# Patient Record
Sex: Male | Born: 2005 | Race: Black or African American | Hispanic: No | Marital: Single | Smoking: Never smoker
Health system: Southern US, Community
[De-identification: ages and names within clinical notes are randomized; demographics above are authoritative.]

---
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
## 2023-06-04 IMAGING — DX DG FINGER MIDDLE 2+V*R*
3 series · 3 of 3 positions shown · non-contrast
Comparison: None.

CLINICAL DATA: Trauma

EXAM:
RIGHT MIDDLE FINGER 2+V

[finger obl]
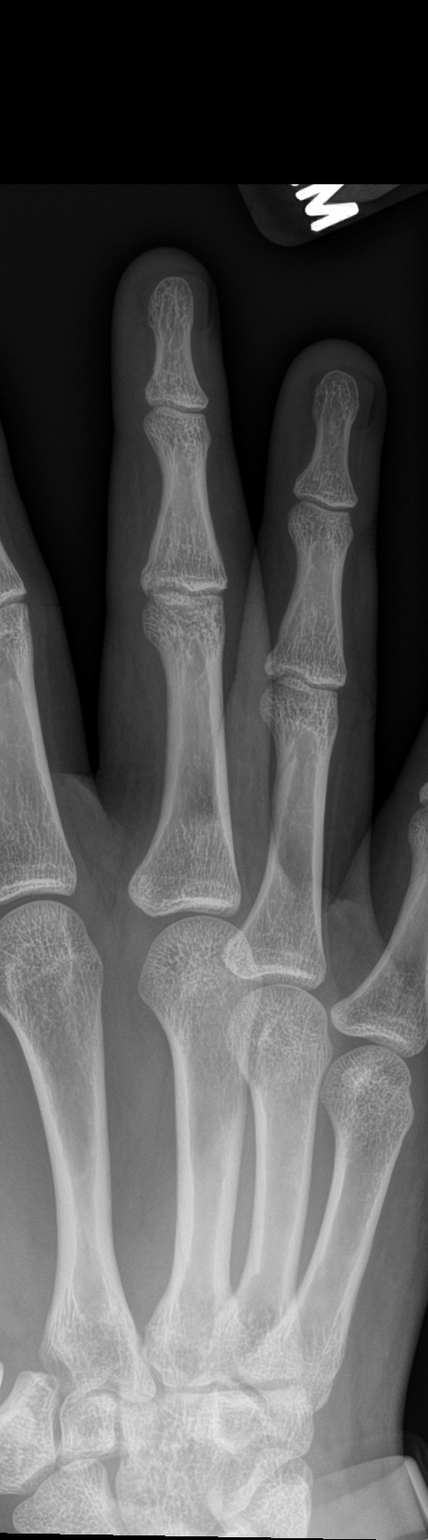

[finger lat]
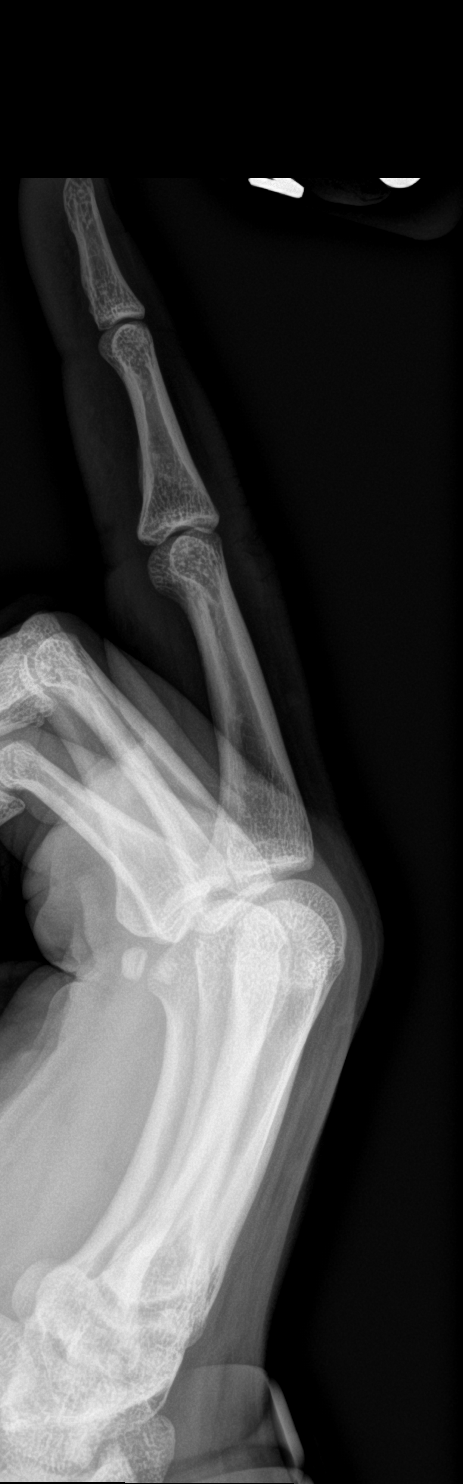

[finger ap]
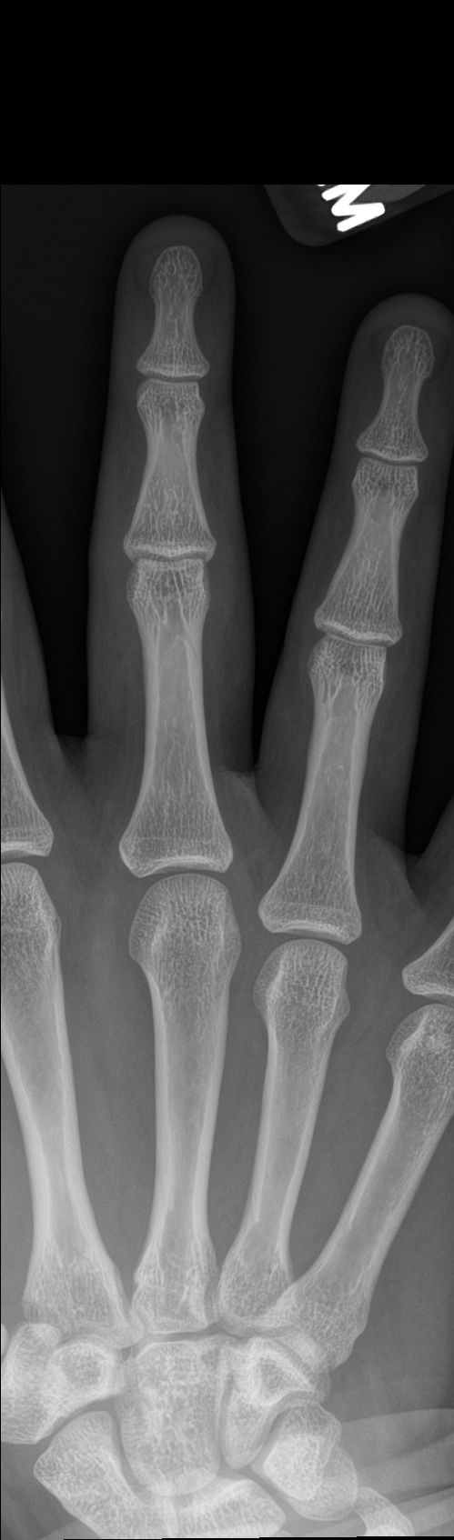

[3 of 3 positions shown; findings below may reference images not displayed]

FINDINGS: There is no evidence of fracture or dislocation. There is no
evidence of arthropathy or other focal bone abnormality. There is
soft tissue swelling over the PIP joint.
IMPRESSION: No fracture or dislocation is seen in right middle finger. There are
no opaque foreign bodies.

Reading location: [place], [place].

## 2023-06-08 ENCOUNTER — Ambulatory Visit: Admission: EM | Admit: 2023-06-08 | Discharge: 2023-06-08 | Disposition: A | Discharge status: Home / Self Care

## 2023-06-08 ENCOUNTER — Encounter: Payer: Self-pay | Admitting: Emergency Medicine

## 2023-06-08 DIAGNOSIS — R17 Unspecified jaundice: Secondary | ICD-10-CM

## 2023-06-08 NOTE — ED Triage Notes (Addendum)
 Pt reports yellowing of both eyes x41yr. No vision changes or other symptoms. Does not have PCP. Pt unsure of the last time he had physical or blood work, but reports it has been [employer]. Eyes appear jaundiced. No family hx of liver or pancreas conditions.

## 2023-06-08 NOTE — Discharge Instructions (Addendum)
 You are being scheduled with a primary care doctor for further work up and evaluation of eye yellowing for over one year.

## 2023-06-08 NOTE — ED Provider Notes (Signed)
 [facility]    CSN: [id number] Arrival date & time: 06/08/23  1719      History   Chief Complaint Chief Complaint  Patient presents with   Eye Problem    HPI Barry Buchanan is a 18 y.o. male.  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  Patient has not had any medical or physical exam since he was an adolescent. He reports no visual acuity changes but people have been telling him that his eyes are yellowing more and he has noticed that the occurrences are coming more frequent. Patient is non drinker, no hx of liver disease, sickle cell, or anemia . He is a contact lens wearer with last eye less than one year ago. History reviewed. No pertinent past medical history.  Patient Active Problem List   Diagnosis Date Noted   Obesity 01/08/2016    History reviewed. No pertinent surgical history.     Home Medications    Prior to Admission medications   Not on File    Family History History reviewed. No pertinent family history.  Social History Social History   Tobacco Use   Smoking status: Never   Smokeless tobacco: Never  Vaping Use   Vaping status: Some Days   Substances: Nicotine  Substance Use Topics   Alcohol use: No   Drug use: No     Allergies   Patient has no known allergies.   Review of Systems Review of Systems Pertinent negatives listed in HPI   Physical Exam Triage Vital Signs ED Triage Vitals  Encounter Vitals Group     BP 06/08/23 1812 123/78     Systolic BP Percentile --      Diastolic BP Percentile --      Pulse Rate 06/08/23 1812 64     Resp 06/08/23 1812 14     Temp 06/08/23 1812 98.6 F (37 C)     Temp Source 06/08/23 1812 Oral     SpO2 06/08/23 1812 96 %     Weight --      Height --      Head Circumference --      Peak Flow --      Pain Score 06/08/23 1813 0     Pain Loc --      Pain Education --      Exclude from Growth Chart --     No data found.  Updated Vital Signs BP 123/78 (BP Location: Left Arm)   Pulse 64   Temp 98.6 F (37 C) (Oral)   Resp 14   SpO2 96%   Visual Acuity Right Eye Distance:   Left Eye Distance:   Bilateral Distance:    Right Eye Near:   Left Eye Near:    Bilateral Near:     Physical Exam Vitals reviewed.  Constitutional:      General: He is not in acute distress.    Appearance: Normal appearance. He is not ill-appearing.  HENT:     Head: Normocephalic and atraumatic.     Nose: Nose normal.  Eyes:     General: Scleral icterus present.     Extraocular Movements: Extraocular movements intact.     Conjunctiva/sclera: Conjunctivae normal.     Pupils: Pupils are equal, round, and reactive to light.  Cardiovascular:     Rate and Rhythm: Normal rate and regular rhythm.  Pulmonary:     Effort: Pulmonary effort is normal.  Breath sounds: Normal breath sounds.  Abdominal:     General: Abdomen is flat. Bowel sounds are normal.  Musculoskeletal:        General: Normal range of motion.     Cervical back: Normal range of motion and neck supple.  Skin:    General: Skin is warm and dry.  Neurological:     General: No focal deficit present.     Mental Status: He is alert and oriented to person, place, and time.      UC Treatments / Results  Labs (all labs ordered are listed, but only abnormal results are displayed) Labs Reviewed - No data to display  EKG   Radiology No results found.  Procedures Procedures (including critical care time)  Medications Ordered in UC Medications - No data to display  Initial Impression / Assessment and Plan / UC Course  I have reviewed the triage vital signs and the nursing notes.  Pertinent labs & imaging results that were available during my care of the patient were reviewed by me and considered in my medical decision making (see chart for details).    Patient schedule to establish with a new primary care provider tomorrow  morning. Work note provided to cover morning abscess from work. Patient verbalized the importance of making this scheduled appointment.   Discharge Instructions      You are being scheduled with a primary care doctor for further work up and evaluation of eye yellowing for over one year.      ED Prescriptions   None    PDMP not reviewed this encounter.   Bing Neighbors, NP 06/08/23 2337

## 2023-06-09 ENCOUNTER — Encounter: Payer: Self-pay | Admitting: Family Medicine

## 2023-06-09 ENCOUNTER — Ambulatory Visit: Admitting: Family Medicine

## 2023-06-09 VITALS — BP 143/73 | HR 65 | Temp 98.1°F | Ht 72.0 in | Wt 215.0 lb

## 2023-06-09 DIAGNOSIS — Z7689 Persons encountering health services in other specified circumstances: Secondary | ICD-10-CM | POA: Diagnosis not present

## 2023-06-09 DIAGNOSIS — R17 Unspecified jaundice: Secondary | ICD-10-CM

## 2023-06-09 NOTE — Assessment & Plan Note (Addendum)
 Jaundice present for one year. Sclera yellow.  Denies alcohol use, nausea, vomiting, abdominal pain. Negative abdominal exam in office.  No family history of liver problems. Labs today. Abdominal ultrasound for further evaluation. Will need referral to specialist.

## 2023-06-09 NOTE — Progress Notes (Signed)
 New Patient Office Visit  Subjective    Patient ID: Barry Buchanan, male    DOB: 2005-07-23  Age: 18 y.o. MRN: [id number]  CC:  Chief Complaint  Patient presents with   Establish Care   Scleral Icterus    Urgent Care follow up    HPI Barry Buchanan presents to establish care with this practice. He is new to me.  Past year, "my eyes have been yellow" Not endorsing regular alcohol use.  No abdominal pain, nausea, vomiting, diarrhea. No symptoms associated with eating. No family history of liver problems.    Chart review: 4/9: urgent care Scleral icterus    No outpatient encounter medications on file as of 06/09/2023.   No facility-administered encounter medications on file as of 06/09/2023.    History reviewed. No pertinent past medical history.  History reviewed. No pertinent surgical history.  History reviewed. No pertinent family history.  Social History   Socioeconomic History   Marital status: Single    Spouse name: Not on file   Number of children: Not on file   Years of education: Not on file   Highest education level: Not on file  Occupational History   Not on file  Tobacco Use   Smoking status: Never   Smokeless tobacco: Never  Vaping Use   Vaping status: Some Days   Substances: Nicotine  Substance and Sexual Activity   Alcohol use: No   Drug use: No   Sexual activity: Not on file  Other Topics Concern   Not on file  Social History Narrative   Not on file   Social Drivers of Health   Financial Resource Strain: Not on file  Food Insecurity: Not on file  Transportation Needs: Not on file  Physical Activity: Not on file  Stress: Not on file  Social Connections: Not on file  Intimate Partner Violence: Not on file    Review of Systems  Gastrointestinal:  Negative for abdominal pain, constipation, diarrhea, nausea and vomiting.        Objective    BP (!) 143/73 (BP Location: Left Arm, Patient Position: Sitting, Cuff Size:  Large)   Pulse 65   Temp 98.1 F (36.7 C) (Oral)   Ht 6' (1.829 m)   Wt 215 lb (97.5 kg)   SpO2 98%   BMI 29.16 kg/m   Physical Exam Vitals and nursing note reviewed.  Constitutional:      General: He is not in acute distress.    Appearance: Normal appearance.  Cardiovascular:     Rate and Rhythm: Normal rate and regular rhythm.     Heart sounds: Normal heart sounds.  Pulmonary:     Effort: Pulmonary effort is normal.     Breath sounds: Normal breath sounds.  Abdominal:     General: Bowel sounds are normal.     Palpations: Abdomen is soft.     Tenderness: There is no abdominal tenderness.  Skin:    General: Skin is warm and dry.  Neurological:     General: No focal deficit present.     Mental Status: He is alert. Mental status is at baseline.  Psychiatric:        Mood and Affect: Mood normal.        Behavior: Behavior normal.        Thought Content: Thought content normal.        Judgment: Judgment normal.        Assessment & Plan:   Problem List  Items Addressed This Visit     Establishing care with new doctor, encounter for - Primary   Scleral icterus   Jaundice present for one year. Sclera yellow.  Denies alcohol use, nausea, vomiting, abdominal pain. Negative abdominal exam in office.  No family history of liver problems. Labs today. Abdominal ultrasound for further evaluation. Will need referral to specialist.      Relevant Orders   CBC with Differential/Platelet   Comprehensive metabolic panel with GFR   Lipid panel   Acute Viral Hepatitis (HAV, HBV, HCV)   HIV Antibody (routine testing w rflx)   US Abdomen Complete  Agrees with plan of care discussed.  Questions answered.   Return for to be determined .   Novella Olive, FNP

## 2023-06-10 ENCOUNTER — Ambulatory Visit (INDEPENDENT_AMBULATORY_CARE_PROVIDER_SITE_OTHER)

## 2023-06-10 DIAGNOSIS — R17 Unspecified jaundice: Secondary | ICD-10-CM | POA: Diagnosis not present

## 2023-06-11 LAB — LIPID PANEL
Chol/HDL Ratio: 2.2 ratio (ref 0.0–5.0)
Cholesterol, Total: 132 mg/dL (ref 100–169)
HDL: 61 mg/dL (ref >39)
LDL Chol Calc (NIH): 58 mg/dL (ref 0–109)
Triglycerides: 63 mg/dL (ref 0–89)
VLDL Cholesterol Cal: 13 mg/dL (ref 5–40)

## 2023-06-11 LAB — CBC WITH DIFFERENTIAL/PLATELET
Basophils Absolute: 0 10*3/uL (ref 0.0–0.2)
Basos: 1 %
EOS (ABSOLUTE): 0.1 10*3/uL (ref 0.0–0.4)
Eos: 2 %
Hematocrit: 38.9 % (ref 37.5–51.0)
Hemoglobin: 12.9 g/dL — ABNORMAL LOW (ref 13.0–17.7)
Immature Grans (Abs): 0 10*3/uL (ref 0.0–0.1)
Immature Granulocytes: 0 %
Lymphocytes Absolute: 1.1 10*3/uL (ref 0.7–3.1)
Lymphs: 31 %
MCH: 32.1 pg (ref 26.6–33.0)
MCHC: 33.2 g/dL (ref 31.5–35.7)
MCV: 97 fL (ref 79–97)
Monocytes Absolute: 0.3 10*3/uL (ref 0.1–0.9)
Monocytes: 9 %
Neutrophils Absolute: 2.1 10*3/uL (ref 1.4–7.0)
Neutrophils: 57 %
Platelets: 271 10*3/uL (ref 150–450)
RBC: 4.02 x10E6/uL — ABNORMAL LOW (ref 4.14–5.80)
RDW: 11.8 % (ref 11.6–15.4)
WBC: 3.6 10*3/uL (ref 3.4–10.8)

## 2023-06-11 LAB — COMPREHENSIVE METABOLIC PANEL WITH GFR
ALT: 16 IU/L (ref 0–44)
AST: 24 IU/L (ref 0–40)
Albumin: 5 g/dL (ref 4.3–5.2)
Alkaline Phosphatase: 81 IU/L (ref 51–125)
BUN/Creatinine Ratio: 7 — ABNORMAL LOW (ref 9–20)
BUN: 8 mg/dL (ref 6–20)
Bilirubin Total: 8.7 mg/dL — ABNORMAL HIGH (ref 0.0–1.2)
CO2: 24 mmol/L (ref 20–29)
Calcium: 10 mg/dL (ref 8.7–10.2)
Chloride: 101 mmol/L (ref 96–106)
Creatinine, Ser: 1.15 mg/dL (ref 0.76–1.27)
Globulin, Total: 2 g/dL (ref 1.5–4.5)
Glucose: 99 mg/dL (ref 70–99)
Potassium: 4.4 mmol/L (ref 3.5–5.2)
Sodium: 141 mmol/L (ref 134–144)
Total Protein: 7 g/dL (ref 6.0–8.5)
eGFR: 94 mL/min/{1.73_m2} (ref >59)

## 2023-06-11 LAB — ACUTE VIRAL HEPATITIS (HAV, HBV, HCV)
HCV Ab: NONREACTIVE
Hep A IgM: NEGATIVE
Hep B C IgM: NEGATIVE
Hepatitis B Surface Ag: NEGATIVE

## 2023-06-11 LAB — HCV INTERPRETATION

## 2023-06-11 LAB — HIV ANTIBODY (ROUTINE TESTING W REFLEX): HIV Screen 4th Generation wRfx: NONREACTIVE

## 2023-06-13 ENCOUNTER — Encounter: Payer: Self-pay | Admitting: Gastroenterology

## 2023-06-13 ENCOUNTER — Encounter: Payer: Self-pay | Admitting: Family Medicine

## 2023-06-13 ENCOUNTER — Other Ambulatory Visit: Payer: Self-pay | Admitting: Family Medicine

## 2023-06-13 DIAGNOSIS — R17 Unspecified jaundice: Secondary | ICD-10-CM

## 2023-06-13 NOTE — Progress Notes (Signed)
 Referral placed for GI for hyperbilirubinemia and jaundice.

## 2023-08-09 ENCOUNTER — Encounter: Payer: Self-pay | Admitting: Gastroenterology

## 2023-08-09 ENCOUNTER — Ambulatory Visit: Admitting: Gastroenterology

## 2023-08-09 ENCOUNTER — Other Ambulatory Visit (INDEPENDENT_AMBULATORY_CARE_PROVIDER_SITE_OTHER)

## 2023-08-09 DIAGNOSIS — D649 Anemia, unspecified: Secondary | ICD-10-CM | POA: Diagnosis not present

## 2023-08-09 LAB — HEPATIC FUNCTION PANEL
ALT: 35 U/L (ref 0–53)
AST: 29 U/L (ref 0–37)
Albumin: 4.8 g/dL (ref 3.5–5.2)
Alkaline Phosphatase: 57 U/L (ref 39–117)
Bilirubin, Direct: 0.8 mg/dL — ABNORMAL HIGH (ref 0.0–0.3)
Total Bilirubin: 4.7 mg/dL — ABNORMAL HIGH (ref 0.2–1.2)
Total Protein: 7.4 g/dL (ref 6.0–8.3)

## 2023-08-09 LAB — CBC WITH DIFFERENTIAL/PLATELET
Basophils Absolute: 0 10*3/uL (ref 0.0–0.1)
Basophils Relative: 0.7 % (ref 0.0–3.0)
Eosinophils Absolute: 0.1 10*3/uL (ref 0.0–0.7)
Eosinophils Relative: 3.2 % (ref 0.0–5.0)
HCT: 40.4 % (ref 39.0–52.0)
Hemoglobin: 13.7 g/dL (ref 13.0–17.0)
Lymphocytes Relative: 37.8 % (ref 12.0–46.0)
Lymphs Abs: 1.4 10*3/uL (ref 0.7–4.0)
MCHC: 33.8 g/dL (ref 30.0–36.0)
MCV: 94 fl (ref 78.0–100.0)
Monocytes Absolute: 0.4 10*3/uL (ref 0.1–1.0)
Monocytes Relative: 9.9 % (ref 3.0–12.0)
Neutro Abs: 1.8 10*3/uL (ref 1.4–7.7)
Neutrophils Relative %: 48.4 % (ref 43.0–77.0)
Platelets: 235 10*3/uL (ref 150.0–400.0)
RBC: 4.3 Mil/uL (ref 4.22–5.81)
RDW: 13.5 % (ref 11.5–14.6)
WBC: 3.6 10*3/uL — ABNORMAL LOW (ref 4.5–10.5)

## 2023-08-09 LAB — GAMMA GT: GGT: 13 U/L (ref 7–51)

## 2023-08-09 NOTE — Patient Instructions (Signed)
 Your provider has requested that you go to the basement level for lab work before leaving today. Press "B" on the elevator. The lab is located at the first door on the left as you exit the elevator.  Due to recent changes in healthcare laws, you may see the results of your imaging and laboratory studies on MyChart before your provider has had a chance to review them.  We understand that in some cases there may be results that are confusing or concerning to you. Not all laboratory results come back in the same time frame and the provider may be waiting for multiple results in order to interpret others.  Please give us  48 hours in order for your provider to thoroughly review all the results before contacting the office for clarification of your results.   _______________________________________________________  If your blood pressure at your visit was 140/90 or greater, please contact your primary care physician to follow up on this.  _______________________________________________________  If you are age 1 or older, your body mass index should be between 23-30. Your Body mass index is 30.79 kg/m. If this is out of the aforementioned range listed, please consider follow up with your Primary Care Provider.  If you are age 1 or younger, your body mass index should be between 19-25. Your Body mass index is 30.79 kg/m. If this is out of the aformentioned range listed, please consider follow up with your Primary Care Provider.   ________________________________________________________  The [facility] providers would like to encourage you to use MYCHART to communicate with providers for non-urgent requests or questions.  Due to long hold times on the telephone, sending your provider a message by [facility] may be a faster and more efficient way to get a response.  Please allow 48 business hours for a response.  Please remember that this is for non-urgent requests.   _______________________________________________________  I appreciate the opportunity to care for you. Elois Hair, MD

## 2023-08-09 NOTE — Progress Notes (Signed)
 Discussed the use of AI scribe software for clinical note transcription with the patient, who gave verbal consent to proceed.  HPI : Barry Buchanan is a 18 year old male who presents with yellowing of the eyes. He was referred after visiting urgent care for yellowing of the eyes.  He experiences recurrent icterus, first noticed after high school graduation. The yellowing occurs approximately two to three times a week, typically during the day and lasting until the next morning, though it has recently become less intense. It is more noticeable after physical activities such as exercise or playing basketball.  This past April, his eyes were particularly yellow, which prompted him to seek medical attention at urgent care.  He recalls feeling well and denies symptoms of malaise, lethargy, nausea/vomiting. Labs at urgent care were notable for a bilirubin of 8.7, with otherwise normal liver enzymes (AST 24, ALT 16, alk phos 81).  CBC notable for very mildly depressed hemoglobin (12.9) with MCV 97.  Viral hepatology serologies were negative.  A subsequent abdominal ultrasound showed normal-appearing liver and spleen.  He also reports dark urine, which was noted during his urgent care visit.   No family history of liver problems or blood diseases, although his mother has low iron levels. He has a history of passing out during blood draws, which he attributes to his aversion to needles.          History reviewed. No pertinent past medical history.   History reviewed. No pertinent surgical history. Family History  Problem Relation Age of Onset   Colon cancer Neg Hx    Stomach cancer Neg Hx    Esophageal cancer Neg Hx    Social History   Tobacco Use   Smoking status: Never   Smokeless tobacco: Never  Vaping Use   Vaping status: Some Days   Substances: Flavoring  Substance Use Topics   Alcohol use: No   Drug use: No   No current outpatient medications on file.   No current  facility-administered medications for this visit.   No Known Allergies   Review of Systems: All systems reviewed and negative except where noted in HPI.    No results found.  Physical Exam: BP 134/70   Pulse 100   Ht 6' (1.829 m)   Wt 227 lb (103 kg)   BMI 30.79 kg/m  Constitutional: Pleasant,well-developed, African American male in no acute distress. HEENT: Normocephalic and atraumatic. Conjunctivae are normal.  Faint scleral icterus at the peripheries Neck supple.  Cardiovascular: Normal rate, regular rhythm.  Pulmonary/chest: Effort normal and breath sounds normal. No wheezing, rales or rhonchi. Abdominal: Soft, nondistended, nontender. Bowel sounds active throughout. There are no masses palpable. No hepatomegaly. Extremities: no edema Lymphadenopathy: No cervical adenopathy noted. Neurological: Alert and oriented to person place and time. Skin: Skin is warm and dry. No rashes noted. Psychiatric: Normal mood and affect. Behavior is normal.  CBC    Component Value Date/Time   WBC 3.6 06/09/2023 0854   RBC 4.02 (L) 06/09/2023 0854   HGB 12.9 (L) 06/09/2023 0854   HCT 38.9 06/09/2023 0854   PLT 271 06/09/2023 0854   MCV 97 06/09/2023 0854   MCH 32.1 06/09/2023 0854   MCHC 33.2 06/09/2023 0854   RDW 11.8 06/09/2023 0854   LYMPHSABS 1.1 06/09/2023 0854   EOSABS 0.1 06/09/2023 0854   BASOSABS 0.0 06/09/2023 0854    CMP     Component Value Date/Time   NA 141 06/09/2023 0854   K  4.4 06/09/2023 0854   CL 101 06/09/2023 0854   CO2 24 06/09/2023 0854   GLUCOSE 99 06/09/2023 0854   BUN 8 06/09/2023 0854   CREATININE 1.15 06/09/2023 0854   CALCIUM 10.0 06/09/2023 0854   PROT 7.0 06/09/2023 0854   ALBUMIN 5.0 06/09/2023 0854   AST 24 06/09/2023 0854   ALT 16 06/09/2023 0854   ALKPHOS 81 06/09/2023 0854   BILITOT 8.7 (H) 06/09/2023 0854       Latest Ref Rng & Units 06/09/2023    8:54 AM  CBC EXTENDED  WBC 3.4 - 10.8 x10E3/uL 3.6   RBC 4.14 - 5.80 x10E6/uL  4.02   Hemoglobin 13.0 - 17.7 g/dL 16.1   HCT 09.6 - 04.5 % 38.9   Platelets 150 - 450 x10E3/uL 271   NEUT# 1.4 - 7.0 x10E3/uL 2.1   Lymph# 0.7 - 3.1 x10E3/uL 1.1       ASSESSMENT AND PLAN:  18 year old male with recurrent episodes of scleral icterus without other symptoms.  Upon questioning, he does believe that the icteric episodes or more likely to be noticed periods of exercise or dehydration.  Labs at the time of an icteric episode were notable for an isolated elevated bilirubin level.  Other liver enzymes completely normal as was abdominal ultrasound.  This is most likely an indirect hyperbilirubinemia, and most likely secondary to [place] syndrome.  The slightly low hemoglobin level does raise possibility of hemolysis, but with the patient having no other symptoms and otherwise feeling well, I think hemolytic anemia is much less likely and Gilbert syndrome.  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 No liver dysfunction or increased risk for liver disease. Differential includes hemolysis.  - Order fractionated bilirubin, LDH, haptoglobin, and GGT to evaluate for hemolysis.  Anemia Mild anemia with slightly low hemoglobin. Differential includes hemolysis. No family history of sickle cell disease or trait. Further evaluation needed to rule out hemolysis. - Order LDH, haptoglobin, and repeat CBC to evaluate for hemolysis. - Refer to hematology if tests indicate hemolysis.     Barry Posthumus E. Cherryl Corona, MD [facility]   [place] Chriss Coup, FNP

## 2023-08-10 LAB — HAPTOGLOBIN: Haptoglobin: 102 mg/dL (ref 43–212)

## 2023-08-10 LAB — LACTATE DEHYDROGENASE: LDH: 140 U/L (ref 100–220)

## 2023-08-11 ENCOUNTER — Ambulatory Visit: Payer: Self-pay | Admitting: Gastroenterology

## 2023-08-11 NOTE — Progress Notes (Signed)
 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  This is a completely benign condition that will not impact your health.  There was no evidence of hemolysis.  There is nothing wrong with your liver.  As discussed, you do not need to be alarmed the next time your eyes turn yellow.  No further follow up is needed.
